# Patient Record
Sex: Female | Born: 1975 | Marital: Married | Smoking: Never smoker
Health system: Southern US, Community
[De-identification: ages and names within clinical notes are randomized; demographics above are authoritative.]

---

## 1998-04-20 ENCOUNTER — Other Ambulatory Visit: Admission: RE | Admit: 1998-04-20 | Discharge: 1998-04-20 | Payer: Self-pay | Admitting: Gynecology

## 1998-04-20 ENCOUNTER — Other Ambulatory Visit: Admission: RE | Admit: 1998-04-20 | Discharge: 1998-04-20 | Payer: Self-pay | Admitting: Obstetrics and Gynecology

## 1999-05-03 ENCOUNTER — Other Ambulatory Visit: Admission: RE | Admit: 1999-05-03 | Discharge: 1999-05-03 | Payer: Self-pay | Admitting: Gynecology

## 2002-05-17 ENCOUNTER — Other Ambulatory Visit: Admission: RE | Admit: 2002-05-17 | Discharge: 2002-05-17 | Payer: Self-pay | Admitting: Gynecology

## 2006-02-23 ENCOUNTER — Ambulatory Visit: Payer: Self-pay | Admitting: Internal Medicine

## 2006-08-07 ENCOUNTER — Ambulatory Visit: Payer: Self-pay | Admitting: Gastroenterology

## 2006-09-26 ENCOUNTER — Ambulatory Visit: Payer: Self-pay | Admitting: Gastroenterology

## 2007-04-26 ENCOUNTER — Ambulatory Visit: Payer: Self-pay | Admitting: Internal Medicine

## 2007-04-29 ENCOUNTER — Ambulatory Visit: Payer: Self-pay | Admitting: Internal Medicine

## 2007-05-29 ENCOUNTER — Ambulatory Visit: Payer: Self-pay | Admitting: Internal Medicine

## 2007-06-29 ENCOUNTER — Ambulatory Visit: Payer: Self-pay | Admitting: Internal Medicine

## 2007-07-30 ENCOUNTER — Ambulatory Visit: Payer: Self-pay | Admitting: Internal Medicine

## 2007-08-29 ENCOUNTER — Ambulatory Visit: Payer: Self-pay | Admitting: Internal Medicine

## 2007-09-04 ENCOUNTER — Ambulatory Visit: Payer: Self-pay | Admitting: Internal Medicine

## 2007-09-06 ENCOUNTER — Ambulatory Visit: Payer: Self-pay | Admitting: Internal Medicine

## 2007-09-29 ENCOUNTER — Ambulatory Visit: Payer: Self-pay | Admitting: Internal Medicine

## 2007-11-06 ENCOUNTER — Ambulatory Visit: Payer: Self-pay | Admitting: Internal Medicine

## 2007-11-29 ENCOUNTER — Ambulatory Visit: Payer: Self-pay | Admitting: Internal Medicine

## 2007-12-06 ENCOUNTER — Ambulatory Visit: Payer: Self-pay | Admitting: Internal Medicine

## 2007-12-30 ENCOUNTER — Ambulatory Visit: Payer: Self-pay | Admitting: Internal Medicine

## 2008-02-06 ENCOUNTER — Ambulatory Visit: Payer: Self-pay | Admitting: Internal Medicine

## 2008-02-27 ENCOUNTER — Ambulatory Visit: Payer: Self-pay | Admitting: Internal Medicine

## 2008-03-06 ENCOUNTER — Ambulatory Visit: Payer: Self-pay | Admitting: Internal Medicine

## 2008-03-28 ENCOUNTER — Ambulatory Visit: Payer: Self-pay | Admitting: Internal Medicine

## 2008-05-28 ENCOUNTER — Ambulatory Visit: Payer: Self-pay | Admitting: Internal Medicine

## 2008-06-03 ENCOUNTER — Ambulatory Visit: Payer: Self-pay | Admitting: Internal Medicine

## 2008-06-28 ENCOUNTER — Ambulatory Visit: Payer: Self-pay | Admitting: Internal Medicine

## 2008-07-29 ENCOUNTER — Ambulatory Visit: Payer: Self-pay | Admitting: Internal Medicine

## 2008-08-28 ENCOUNTER — Ambulatory Visit: Payer: Self-pay | Admitting: Internal Medicine

## 2008-09-03 ENCOUNTER — Ambulatory Visit: Payer: Self-pay | Admitting: Internal Medicine

## 2008-11-19 ENCOUNTER — Ambulatory Visit: Payer: Self-pay | Admitting: Internal Medicine

## 2008-11-28 ENCOUNTER — Ambulatory Visit: Payer: Self-pay | Admitting: Internal Medicine

## 2012-04-24 DIAGNOSIS — E039 Hypothyroidism, unspecified: Secondary | ICD-10-CM | POA: Insufficient documentation

## 2018-03-01 ENCOUNTER — Ambulatory Visit: Payer: BLUE CROSS/BLUE SHIELD | Admitting: Family Medicine

## 2018-03-01 ENCOUNTER — Other Ambulatory Visit: Payer: Self-pay

## 2018-03-01 ENCOUNTER — Ambulatory Visit (INDEPENDENT_AMBULATORY_CARE_PROVIDER_SITE_OTHER): Payer: BLUE CROSS/BLUE SHIELD

## 2018-03-01 ENCOUNTER — Encounter: Payer: Self-pay | Admitting: Family Medicine

## 2018-03-01 ENCOUNTER — Telehealth: Payer: Self-pay | Admitting: Family Medicine

## 2018-03-01 VITALS — BP 110/73 | HR 112 | Temp 99.5°F | Resp 16 | Ht 62.0 in | Wt 195.0 lb

## 2018-03-01 DIAGNOSIS — J208 Acute bronchitis due to other specified organisms: Secondary | ICD-10-CM | POA: Diagnosis not present

## 2018-03-01 DIAGNOSIS — R05 Cough: Secondary | ICD-10-CM

## 2018-03-01 DIAGNOSIS — J181 Lobar pneumonia, unspecified organism: Secondary | ICD-10-CM

## 2018-03-01 DIAGNOSIS — R059 Cough, unspecified: Secondary | ICD-10-CM

## 2018-03-01 DIAGNOSIS — J189 Pneumonia, unspecified organism: Secondary | ICD-10-CM

## 2018-03-01 MED ORDER — AZITHROMYCIN 250 MG PO TABS: ORAL_TABLET | ORAL | 0 refills | Status: AC

## 2018-03-01 MED ORDER — AZITHROMYCIN 250 MG PO TABS: ORAL_TABLET | ORAL | 0 refills | Status: DC

## 2018-03-01 NOTE — Telephone Encounter (Signed)
Done and pt advised.

## 2018-03-01 NOTE — Patient Instructions (Addendum)
allergra once a day Albuterol every six hours Fluticasone once a day - one spray each nostril     IF you received an x-ray today, you will receive an invoice from [facility]. Please contact [facility] at [phone] with questions or concerns regarding your invoice.   IF you received labwork today, you will receive an invoice from [facility]. Please contact [facility] at [phone] with questions or concerns regarding your invoice.   Our billing staff will not be able to assist you with questions regarding bills from these companies.  You will be contacted with the lab results as soon as they are available. The fastest way to get your results is to activate your My Chart account. Instructions are located on the last page of this paperwork. If you have not heard from us regarding the results in 2 weeks, please contact this office.     Cough, Adult Coughing is a reflex that clears your throat and your airways. Coughing helps to heal and protect your lungs. It is normal to cough occasionally, but a cough that happens with other symptoms or lasts a long time may be a sign of a condition that needs treatment. A cough may last only 2-3 weeks (acute), or it may last longer than 8 weeks (chronic). What are the causes? Coughing is commonly caused by:  Breathing in substances that irritate your lungs.  A viral or bacterial respiratory infection.  Allergies.  Asthma.  Postnasal drip.  Smoking.  Acid backing up from the stomach into the esophagus (gastroesophageal reflux).  Certain medicines.  Chronic lung problems, including COPD (or rarely, lung cancer).  Other medical conditions such as heart failure.  Follow these instructions at home: Pay attention to any changes in your symptoms. Take these actions to help with your discomfort:  Take medicines only as told by your health care provider. ? If you were prescribed an antibiotic medicine, take it as told by your  health care provider. Do not stop taking the antibiotic even if you start to feel better. ? Talk with your health care provider before you take a cough suppressant medicine.  Drink enough fluid to keep your urine clear or pale yellow.  If the air is dry, use a cold steam vaporizer or humidifier in your bedroom or your home to help loosen secretions.  Avoid anything that causes you to cough at work or at home.  If your cough is worse at night, try sleeping in a semi-upright position.  Avoid cigarette smoke. If you smoke, quit smoking. If you need help quitting, ask your health care provider.  Avoid caffeine.  Avoid alcohol.  Rest as needed.  Contact a health care provider if:  You have new symptoms.  You cough up pus.  Your cough does not get better after 2-3 weeks, or your cough gets worse.  You cannot control your cough with suppressant medicines and you are losing sleep.  You develop pain that is getting worse or pain that is not controlled with pain medicines.  You have a fever.  You have unexplained weight loss.  You have night sweats. Get help right away if:  You cough up blood.  You have difficulty breathing.  Your heartbeat is very fast. This information is not intended to replace advice given to you by your health care provider. Make sure you discuss any questions you have with your health care provider. Document Released: 05/13/2011 Document Revised: 04/21/2016 Document Reviewed: 01/21/2015 Elsevier Interactive Patient Education  2018 Elsevier  Inc.  

## 2018-03-01 NOTE — Telephone Encounter (Signed)
Copied from CRM [phone]. Topic: General - Other >> Mar 01, 2018  2:51 PM Cecelia ByarsGreen, Temeka L, RMA wrote: Reason for CRM: Patient is requesting medication azithromycin (ZITHROMAX) 250 MG [facility] to [facility] does not accept her insurance coverage

## 2018-03-01 NOTE — Progress Notes (Signed)
Chief Complaint  Patient presents with  . URI    seen on 3/16 for cough/cold sxs-told by MD to take robitussin, mucinex, tylenol.  Highest temp 101.7 , chills, upset stomach, chest hurts from coughing, lungs hurt.  Mucinex dm this morning.  Per pt she coughed 700 times yesterday, coughing is causing low back pain and causing her to urinate on .  Pt received flu shot this season.    HPI   Pt reports that she had cough, fever and chills and was seen 02/10/18 and was negative for flu She recovered and noted that she was better for 4 days She reports that she worked the [employer], [employer] [facility] She states that she felt congestion in her chest and nose The cough returned  mucinex has not cleared it She had a fever with tmax 101.7 She states that she is taking mucinex DM and tylenol She reports that she has a nonproductive cough She states that her body hurts She reports that she was worried about bronchitis or pneumonia    History reviewed. No pertinent past medical history.  Current Outpatient Medications  Medication Sig Dispense Refill  . azithromycin (ZITHROMAX) 250 MG tablet Take 2 tablets on day 1, then 1 tablet each day 6 tablet 0  . levothyroxine (SYNTHROID, LEVOTHROID) 175 MCG tablet Take 1 tablet by mouth daily.    Marland Kitchen. PROAIR HFA 108 (90 Base) MCG/ACT inhaler      No current facility-administered medications for this visit.     Allergies:  Allergies  Allergen Reactions  . Aspirin     CANNOT TAKE DUE TO LIVER RESECTION    History reviewed. No pertinent surgical history.  Social History   Socioeconomic History  . Marital status: Married    Spouse name: Not on file  . Number of children: Not on file  . Years of education: Not on file  . Highest education level: Not on file  Occupational History  . Not on file  Social Needs  . Financial resource strain: Not on file  . Food insecurity:    Worry: Not on file    Inability: Not on file  .  Transportation needs:    Medical: Not on file    Non-medical: Not on file  Tobacco Use  . Smoking status: Never Smoker  . Smokeless tobacco: Never Used  Substance and Sexual Activity  . Alcohol use: Not Currently    Alcohol/week: 1.8 oz    Types: 3 Glasses of wine per week  . Drug use: Never  . Sexual activity: Yes  Lifestyle  . Physical activity:    Days per week: Not on file    Minutes per session: Not on file  . Stress: Not on file  Relationships  . Social connections:    Talks on phone: Not on file    Gets together: Not on file    Attends religious service: Not on file    Active member of club or organization: Not on file    Attends meetings of clubs or organizations: Not on file    Relationship status: Not on file  Other Topics Concern  . Not on file  Social History Narrative  . Not on file    Family History  Problem Relation Age of Onset  . Cancer Neg Hx      ROS Review of Systems See HPI Constitution: No fevers or chills No malaise No diaphoresis Skin: No rash or itching Eyes: no blurry vision,  no double vision GU: no dysuria or hematuria Neuro: no dizziness or headaches * all others reviewed and negative   Objective: Vitals:   03/01/18 1131  BP: 110/73  Pulse: (!) 112  Resp: 16  Temp: 99.5 F (37.5 C)  TempSrc: Oral  SpO2: 94%  Weight: 195 lb (88.5 kg)  Height: 5\' 2"  (1.575 m)    Physical Exam General: alert, oriented, in NAD Head: normocephalic, atraumatic, no sinus tenderness Eyes: EOM intact, no scleral icterus or conjunctival injection Ears: TM clear bilaterally Nose: mucosa nonerythematous, nonedematous Throat: no pharyngeal exudate or erythema Lymph: no posterior auricular, submental or cervical lymph adenopathy Heart: normal rate, normal sinus rhythm, no murmurs Lungs: clear to auscultation bilaterally, no wheezing  Narrative    CLINICAL DATA: Persistent cough  EXAM: CHEST - 2 VIEW  COMPARISON: None.  FINDINGS: There  is parenchymal opacity in the posterior right lower lobe consistent with right lower lobe pneumonia. The remainder of the lungs are clear. There is some peribronchial thickening as well which may indicate bronchitis. Mediastinal and hilar contours are unremarkable and the heart is within normal limits in size. No bony abnormality is seen.  IMPRESSION: 1. Right lower lobe pneumonia. 2. Peribronchial thickening also present consistent with bronchitis.   Electronically Signed By: Dwyane Dee M.D. On: 03/01/2018 12:31      Assessment and Plan Tara Mendoza was seen today for uri.  Diagnoses and all orders for this visit:  Cough -     DG Chest 2 View; Future  Community acquired pneumonia of right lower lobe of lung (HCC)  Acute bronchitis due to other specified organisms  Other orders -     Discontinue: azithromycin (ZITHROMAX) 250 MG tablet; Take 2 tablets on day 1, then 1 tablet each day -     azithromycin (ZITHROMAX) 250 MG tablet; Take 2 tablets on day 1, then 1 tablet each day  Advised zithromax, continue albuterol, mucinex, add allegra and flonase Follow up in 1 week with PCP Repeat xray in 4-6 weeks Patient verbalized understanding   Tara Mendoza

## 2019-01-28 IMAGING — DX DG CHEST 2V
2 series · 2 of 2 positions shown · non-contrast
Comparison: None.

CLINICAL DATA: Persistent cough

EXAM:
CHEST - 2 VIEW

[chest pa]
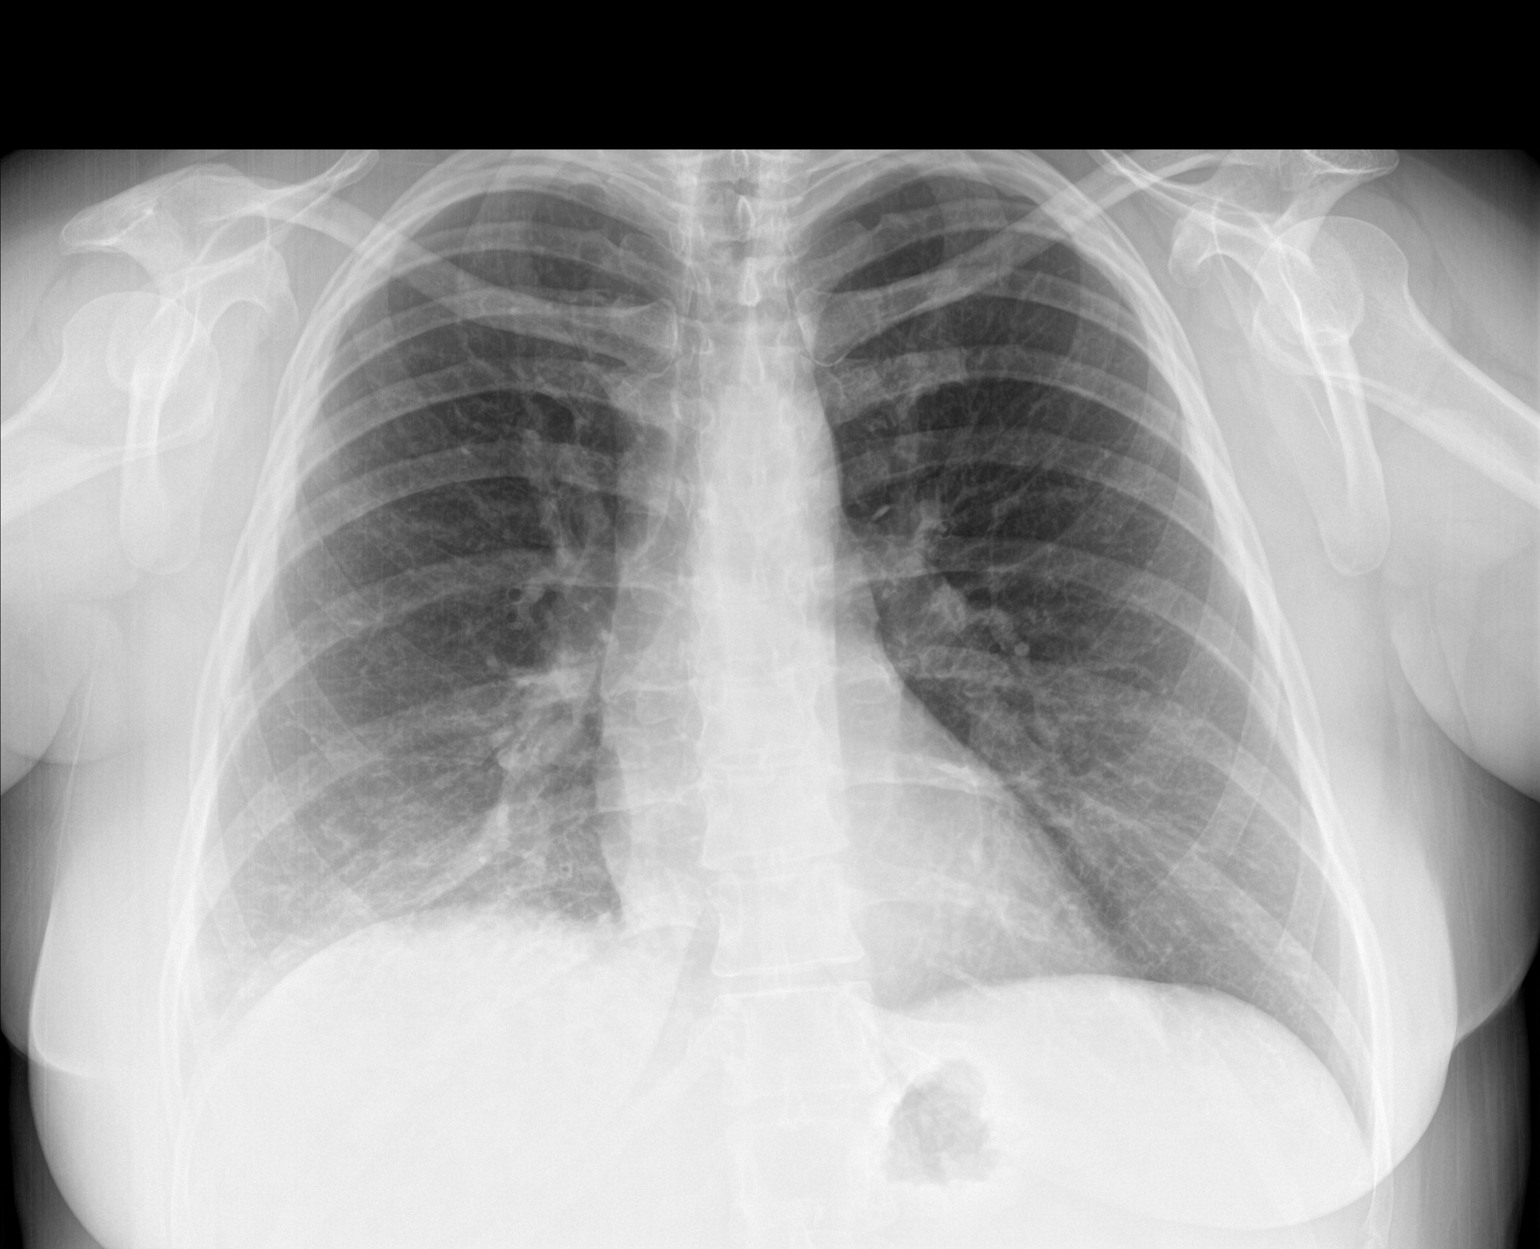

[chest lat]
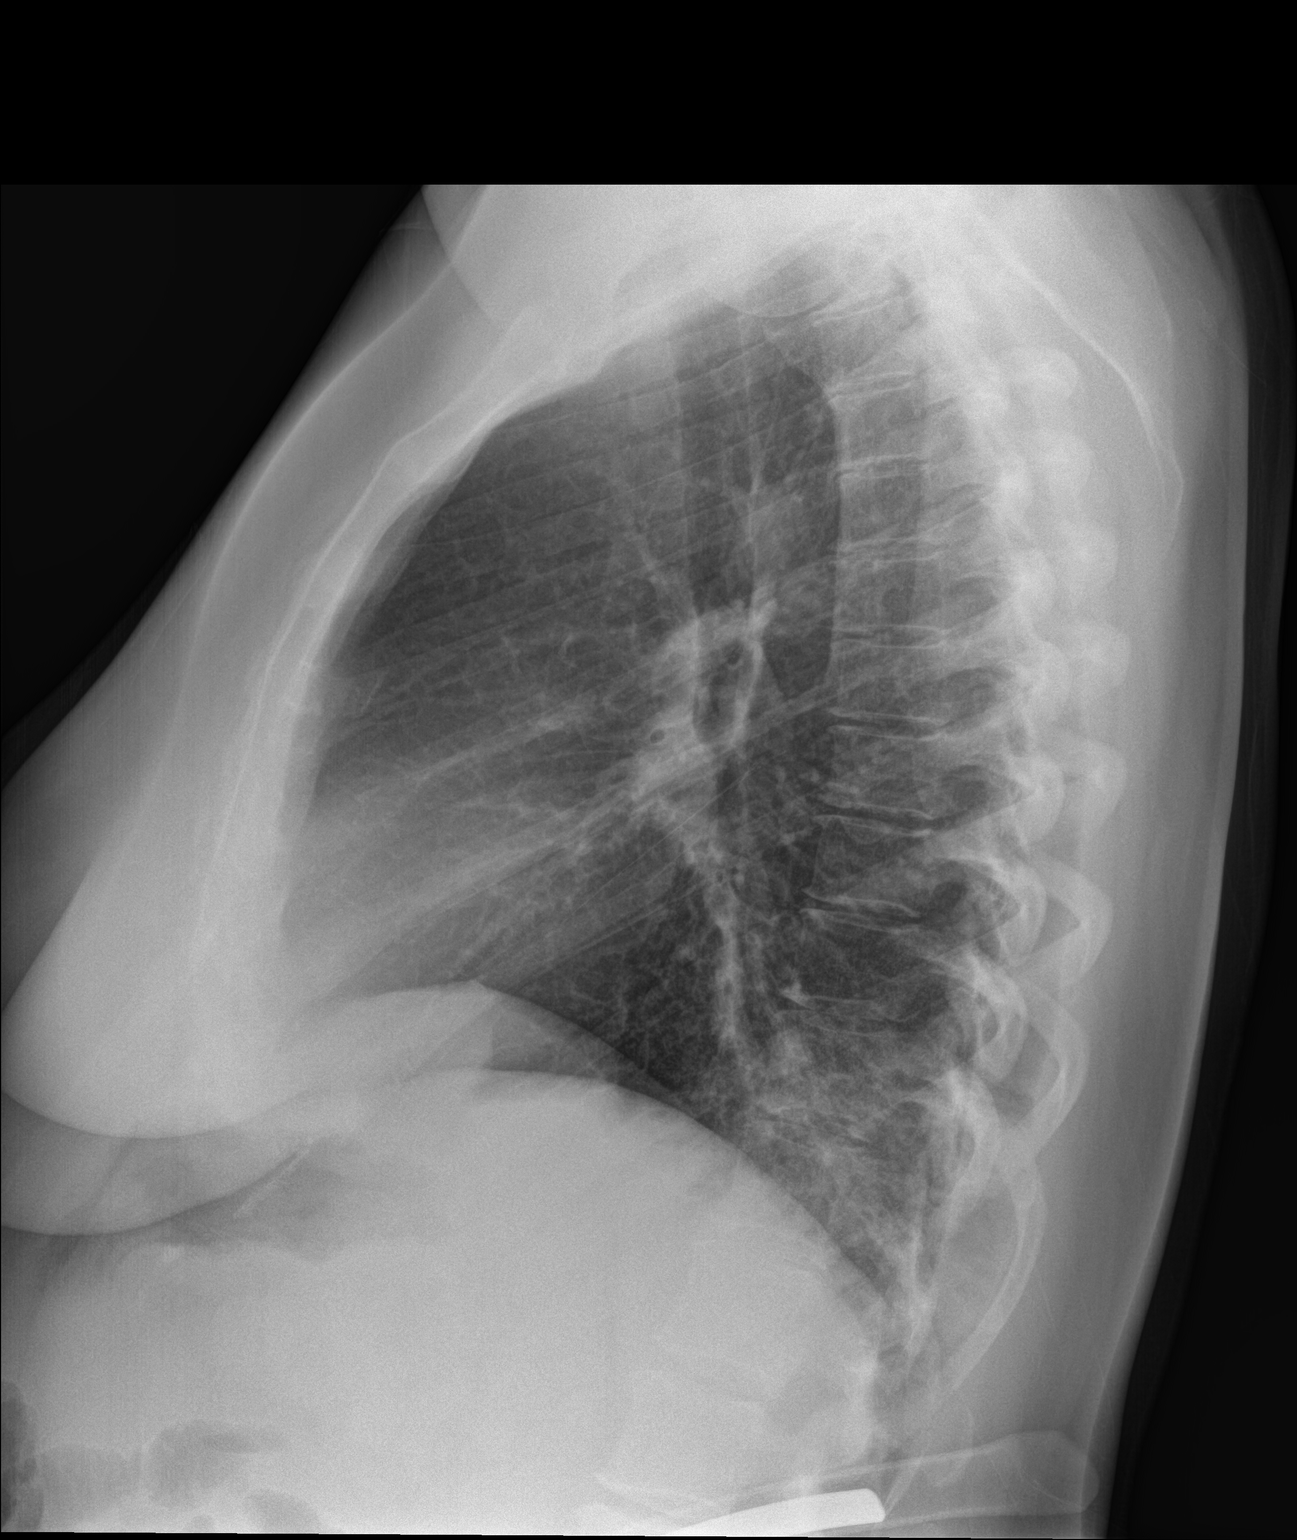

[2 of 2 positions shown; findings below may reference images not displayed]

FINDINGS: There is parenchymal opacity in the posterior right lower lobe
consistent with right lower lobe pneumonia. The remainder of the
lungs are clear. There is some peribronchial thickening as well
which may indicate bronchitis. Mediastinal and hilar contours are
unremarkable and the heart is within normal limits in size. No bony
abnormality is seen.
IMPRESSION: 1. Right lower lobe pneumonia.
2. Peribronchial thickening also present consistent with bronchitis.
